# Patient Record
Sex: Male | Born: 1985 | Hispanic: Yes | Marital: Single | Smoking: Former smoker
Health system: Southern US, Community
[De-identification: ages and names within clinical notes are randomized; demographics above are authoritative.]

## PROBLEM LIST (undated history)

## (undated) DIAGNOSIS — J45909 Unspecified asthma, uncomplicated: Secondary | ICD-10-CM

## (undated) DIAGNOSIS — Z8709 Personal history of other diseases of the respiratory system: Secondary | ICD-10-CM

## (undated) DIAGNOSIS — R011 Cardiac murmur, unspecified: Secondary | ICD-10-CM

## (undated) DIAGNOSIS — K219 Gastro-esophageal reflux disease without esophagitis: Secondary | ICD-10-CM

## (undated) HISTORY — DX: Gastro-esophageal reflux disease without esophagitis: K21.9

## (undated) HISTORY — DX: Personal history of other diseases of the respiratory system: Z87.09

## (undated) HISTORY — PX: ABDOMINAL SURGERY: SHX537

## (undated) HISTORY — DX: Unspecified asthma, uncomplicated: J45.909

## (undated) HISTORY — PX: APPENDECTOMY: SHX54

---

## 2014-11-19 ENCOUNTER — Emergency Department (HOSPITAL_COMMUNITY): Payer: Self-pay

## 2014-11-19 ENCOUNTER — Emergency Department (HOSPITAL_COMMUNITY)
Admission: EM | Admit: 2014-11-19 | Discharge: 2014-11-19 | Disposition: A | Discharge status: Home / Self Care | Payer: Self-pay | Attending: Emergency Medicine | Admitting: Emergency Medicine

## 2014-11-19 ENCOUNTER — Encounter (HOSPITAL_COMMUNITY): Payer: Self-pay | Admitting: Emergency Medicine

## 2014-11-19 DIAGNOSIS — K219 Gastro-esophageal reflux disease without esophagitis: Secondary | ICD-10-CM | POA: Insufficient documentation

## 2014-11-19 DIAGNOSIS — R011 Cardiac murmur, unspecified: Secondary | ICD-10-CM | POA: Insufficient documentation

## 2014-11-19 DIAGNOSIS — R079 Chest pain, unspecified: Secondary | ICD-10-CM

## 2014-11-19 DIAGNOSIS — R0789 Other chest pain: Secondary | ICD-10-CM | POA: Insufficient documentation

## 2014-11-19 HISTORY — DX: Cardiac murmur, unspecified: R01.1

## 2014-11-19 LAB — CBC WITH DIFFERENTIAL/PLATELET
Basophils Absolute: 0 10*3/uL (ref 0.0–0.1)
Basophils Relative: 0 % (ref 0–1)
Eosinophils Absolute: 0.3 10*3/uL (ref 0.0–0.7)
Eosinophils Relative: 4 % (ref 0–5)
HEMATOCRIT: 45.1 % (ref 39.0–52.0)
HEMOGLOBIN: 15.3 g/dL (ref 13.0–17.0)
LYMPHS PCT: 29 % (ref 12–46)
Lymphs Abs: 2.3 10*3/uL (ref 0.7–4.0)
MCH: 28.5 pg (ref 26.0–34.0)
MCHC: 33.9 g/dL (ref 30.0–36.0)
MCV: 84 fL (ref 78.0–100.0)
Monocytes Absolute: 0.4 10*3/uL (ref 0.1–1.0)
Monocytes Relative: 5 % (ref 3–12)
NEUTROS ABS: 4.9 10*3/uL (ref 1.7–7.7)
Neutrophils Relative %: 62 % (ref 43–77)
PLATELETS: 210 10*3/uL (ref 150–400)
RBC: 5.37 MIL/uL (ref 4.22–5.81)
RDW: 13.5 % (ref 11.5–15.5)
WBC: 8 10*3/uL (ref 4.0–10.5)

## 2014-11-19 LAB — COMPREHENSIVE METABOLIC PANEL
ALK PHOS: 46 U/L (ref 38–126)
ALT: 29 U/L (ref 17–63)
ANION GAP: 10 (ref 5–15)
AST: 24 U/L (ref 15–41)
Albumin: 4.4 g/dL (ref 3.5–5.0)
BILIRUBIN TOTAL: 0.7 mg/dL (ref 0.3–1.2)
BUN: 16 mg/dL (ref 6–20)
CHLORIDE: 101 mmol/L (ref 101–111)
CO2: 28 mmol/L (ref 22–32)
Calcium: 9.2 mg/dL (ref 8.9–10.3)
Creatinine, Ser: 1.04 mg/dL (ref 0.61–1.24)
GFR calc Af Amer: >60 mL/min (ref >60)
GLUCOSE: 92 mg/dL (ref 65–99)
Potassium: 4.2 mmol/L (ref 3.5–5.1)
SODIUM: 139 mmol/L (ref 135–145)
TOTAL PROTEIN: 7.7 g/dL (ref 6.5–8.1)

## 2014-11-19 LAB — TROPONIN I

## 2014-11-19 MED ORDER — GI COCKTAIL ~~LOC~~
30.0000 mL | Freq: Once | ORAL | Status: AC
  Administered 2014-11-19: 30 mL via ORAL
  Filled 2014-11-19: qty 30

## 2014-11-19 MED ORDER — NAPROXEN 500 MG PO TABS: 500.0000 mg | ORAL_TABLET | Freq: Two times a day (BID) | ORAL | Status: DC

## 2014-11-19 MED ORDER — FAMOTIDINE 20 MG PO TABS: 20.0000 mg | ORAL_TABLET | Freq: Two times a day (BID) | ORAL | Status: DC

## 2014-11-19 NOTE — ED Notes (Signed)
PT c/o intermittent central chest pain (tightness) since Monday with headache in the am with generalized weakness. PT denies any cough, fever or SOB. PT states he has a heart murmur but no other heart history.

## 2014-11-19 NOTE — Discharge Instructions (Signed)

## 2014-11-19 NOTE — ED Provider Notes (Signed)
CSN: [id number]     Arrival date & time 11/19/14  1346 History   First MD Initiated Contact with Patient 11/19/14 1556     Chief Complaint  Patient presents with  . Chest Pain    Patient is a 29 y.o. male presenting with chest pain. The history is provided by the patient.  Chest Pain Pain location:  Substernal area Pain quality: pressure and sharp   Radiates to: when he moves certain ways it will go the neck and back. Pain severity:  Moderate Duration:  2 days Timing:  Constant (today more intermittent but constant from tues to thursday) Chronicity:  New Relieved by:  Certain positions Exacerbated by: lying flat. Ineffective treatments: nsaid. Associated symptoms: no abdominal pain, no fever, no lower extremity edema and no shortness of breath   Risk factors: no coronary artery disease, no prior DVT/PE and no smoking   NO family history of heart disease or lung problems   Past Medical History  Diagnosis Date  . Heart murmur    Past Surgical History  Procedure Laterality Date  . Appendectomy     History reviewed. No pertinent family history. History  Substance Use Topics  . Smoking status: Never Smoker   . Smokeless tobacco: Not on file  . Alcohol Use: Yes     Comment: occassionally     Review of Systems  Constitutional: Negative for fever.  Respiratory: Negative for shortness of breath.   Cardiovascular: Positive for chest pain.  Gastrointestinal: Negative for abdominal pain.  All other systems reviewed and are negative.     Allergies  Review of patient's allergies indicates no known allergies.  Home Medications   Prior to Admission medications   Medication Sig Start Date End Date Taking? Authorizing Provider  famotidine (PEPCID) 20 MG tablet Take 1 tablet (20 mg total) by mouth 2 (two) times daily. 11/19/14   Linwood Dibbles, MD  naproxen (NAPROSYN) 500 MG tablet Take 1 tablet (500 mg total) by mouth 2 (two) times daily with a meal. As needed for pain 11/19/14   Linwood Dibbles, MD   BP 119/69 mmHg  Pulse 48  Temp(Src) 97.9 F (36.6 C) (Oral)  Resp 11  Ht 5\' 4"  (1.626 m)  Wt 180 lb (81.647 kg)  BMI 30.88 kg/m2  SpO2 100% Physical Exam  Constitutional: He appears well-developed and well-nourished. No distress.  HENT:  Head: Normocephalic and atraumatic.  Right Ear: External ear normal.  Left Ear: External ear normal.  Eyes: Conjunctivae are normal. Right eye exhibits no discharge. Left eye exhibits no discharge. No scleral icterus.  Neck: Neck supple. No tracheal deviation present.  Cardiovascular: Normal rate, regular rhythm and intact distal pulses.   Pulmonary/Chest: Effort normal and breath sounds normal. No stridor. No respiratory distress. He has no wheezes. He has no rales.  Abdominal: Soft. Bowel sounds are normal. He exhibits no distension. There is no tenderness. There is no rebound and no guarding.  Musculoskeletal: He exhibits no edema or tenderness.  Neurological: He is alert. He has normal strength. No cranial nerve deficit (no facial droop, extraocular movements intact, no slurred speech) or sensory deficit. He exhibits normal muscle tone. He displays no seizure activity. Coordination normal.  Skin: Skin is warm and dry. No rash noted.  Psychiatric: He has a normal mood and affect.  Nursing note and vitals reviewed.   ED Course  Procedures (including critical care time) Labs Review Labs Reviewed  CBC WITH DIFFERENTIAL/PLATELET  COMPREHENSIVE METABOLIC PANEL  TROPONIN I  Imaging Review Dg Chest 2 View  11/19/2014   CLINICAL DATA:  Shortness of breath. Chest tightness. Symptoms over the past 3 days.  EXAM: CHEST  2 VIEW  COMPARISON:  None.  FINDINGS: The heart size and mediastinal contours are within normal limits. Both lungs are clear. The visualized skeletal structures are unremarkable.  IMPRESSION: No active cardiopulmonary disease.   Electronically Signed   By: Gaylyn Rong M.D.   On: 11/19/2014 14:30     EKG  Interpretation   Date/Time:  Thursday November 19 2014 15:52:49 EDT Ventricular Rate:  59 PR Interval:  125 QRS Duration: 94 QT Interval:  385 QTC Calculation: 381 R Axis:   73 Text Interpretation:  Sinus arrhythmia Baseline wander in lead(s) V3 No  significant change since last tracing Confirmed by Adylynn Hertenstein  MD-J, Laurina Fischl  ([id number]) on 11/19/2014 3:57:03 PM      MDM   Final diagnoses:  Chest pain, unspecified chest pain type  Gastroesophageal reflux disease without esophagitis    His symptoms sound somewhat suggestive of gastroesophageal reflux disease however Patient didn't know this much improvement after GI cocktail. His possible symptoms may be related to pericarditis as well considering the positional component.  I doubt that his symptoms are related to acute coronary syndrome. Cardiac enzymes are normal after over 24 hours of symptoms. No evidence of pneumonia. Low risk for PE, Perc negative.  Plan on discharge, outpatient follow up    Linwood Dibbles, MD 11/19/14 1706

## 2016-02-26 ENCOUNTER — Emergency Department (HOSPITAL_COMMUNITY)
Admission: EM | Admit: 2016-02-26 | Discharge: 2016-02-26 | Disposition: A | Discharge status: Home / Self Care | Payer: Self-pay | Attending: Emergency Medicine | Admitting: Emergency Medicine

## 2016-02-26 ENCOUNTER — Encounter (HOSPITAL_COMMUNITY): Payer: Self-pay

## 2016-02-26 DIAGNOSIS — R51 Headache: Secondary | ICD-10-CM | POA: Insufficient documentation

## 2016-02-26 DIAGNOSIS — R519 Headache, unspecified: Secondary | ICD-10-CM

## 2016-02-26 MED ORDER — DIPHENHYDRAMINE HCL 50 MG/ML IJ SOLN: 25.0000 mg | Freq: Once | INTRAMUSCULAR | Status: DC

## 2016-02-26 MED ORDER — METOCLOPRAMIDE HCL 5 MG/ML IJ SOLN
10.0000 mg | Freq: Once | INTRAMUSCULAR | Status: AC
  Administered 2016-02-26: 10 mg via INTRAVENOUS
  Filled 2016-02-26: qty 2

## 2016-02-26 MED ORDER — DIPHENHYDRAMINE HCL 50 MG/ML IJ SOLN
25.0000 mg | Freq: Once | INTRAMUSCULAR | Status: AC
  Administered 2016-02-26: 25 mg via INTRAVENOUS
  Filled 2016-02-26: qty 1

## 2016-02-26 MED ORDER — DEXAMETHASONE SODIUM PHOSPHATE 10 MG/ML IJ SOLN: 10.0000 mg | Freq: Once | INTRAMUSCULAR | Status: DC

## 2016-02-26 MED ORDER — DEXAMETHASONE SODIUM PHOSPHATE 10 MG/ML IJ SOLN
10.0000 mg | Freq: Once | INTRAMUSCULAR | Status: AC
  Administered 2016-02-26: 10 mg via INTRAVENOUS
  Filled 2016-02-26: qty 1

## 2016-02-26 NOTE — ED Triage Notes (Signed)
Pt reports headache and earache X4 days. Pt reports he does not have headaches frequently. Pt a&o X4 and reports he has been having blurred vision as well.

## 2016-02-26 NOTE — ED Provider Notes (Signed)
[facility]-EMERGENCY DEPT Provider Note   CSN: [id number] Arrival date & time: 02/26/16  1301     History   Chief Complaint Chief Complaint  Patient presents with  . Headache  . Otalgia    HPI Mark Kane is a 30 y.o. male.  Patient is a 30 year old male who presents with a headache. He states over the last 4 days he's had fairly constant headache that started in his left side of his head and radiates across his head. He has some pain in the left neck which is worse with movement. He denies any posterior neck pain. He denies any numbness or weakness to his extremities. He had one episode of some blurry vision while he was driving but no other vision changes. No nausea or vomiting. No fevers. No balance problems. No gait abnormalities. No prior history of similar headaches. No recent head injuries. No URI symptoms. He states the pain started gradually and was persistent for about 2 days then it went away completely for a day and then came back again yesterday. He does have some pain in his left ear.    Headache   Pertinent negatives include no fever, no shortness of breath, no nausea and no vomiting.  Otalgia  Associated symptoms include headaches and neck pain. Pertinent negatives include no rhinorrhea, no abdominal pain, no diarrhea, no vomiting, no cough and no rash.    Past Medical History:  Diagnosis Date  . Heart murmur     There are no active problems to display for this patient.   Past Surgical History:  Procedure Laterality Date  . APPENDECTOMY         Home Medications    Prior to Admission medications   Medication Sig Start Date End Date Taking? Authorizing Provider  famotidine (PEPCID) 20 MG tablet Take 1 tablet (20 mg total) by mouth 2 (two) times daily. 11/19/14   Linwood Dibbles, MD  naproxen (NAPROSYN) 500 MG tablet Take 1 tablet (500 mg total) by mouth 2 (two) times daily with a meal. As needed for pain 11/19/14   Linwood Dibbles, MD    Family History History  reviewed. No pertinent family history.  Social History Social History  Substance Use Topics  . Smoking status: Never Smoker  . Smokeless tobacco: Never Used  . Alcohol use Yes     Comment: occassionally      Allergies   Review of patient's allergies indicates no known allergies.   Review of Systems Review of Systems  Constitutional: Negative for chills, diaphoresis, fatigue and fever.  HENT: Positive for ear pain. Negative for congestion, rhinorrhea and sneezing.   Eyes: Negative.   Respiratory: Negative for cough, chest tightness and shortness of breath.   Cardiovascular: Negative for chest pain and leg swelling.  Gastrointestinal: Negative for abdominal pain, blood in stool, diarrhea, nausea and vomiting.  Genitourinary: Negative for difficulty urinating, flank pain, frequency and hematuria.  Musculoskeletal: Positive for neck pain. Negative for arthralgias and back pain.  Skin: Negative for rash.  Neurological: Positive for headaches. Negative for dizziness, speech difficulty, weakness and numbness.     Physical Exam Updated Vital Signs BP 135/80 (BP Location: Left Arm)   Pulse 70   Temp 98 F (36.7 C) (Oral)   Resp 22   Ht 5\' 4"  (1.626 m)   Wt 200 lb (90.7 kg)   SpO2 98%   BMI 34.33 kg/m   Physical Exam  Constitutional: He is oriented to person, place, and time. He appears well-developed and  well-nourished.  HENT:  Head: Normocephalic and atraumatic.  Eyes: Pupils are equal, round, and reactive to light.  No photophobia, fundi not well visualized  Neck: Normal range of motion. Neck supple.  Some point tenderness to the left mid paraspinal area. No other neck tenderness. No meningismus.  Cardiovascular: Normal rate, regular rhythm and normal heart sounds.   Pulmonary/Chest: Effort normal and breath sounds normal. No respiratory distress. He has no wheezes. He has no rales. He exhibits no tenderness.  Abdominal: Soft. Bowel sounds are normal. There is no  tenderness. There is no rebound and no guarding.  Musculoskeletal: Normal range of motion. He exhibits no edema.  Lymphadenopathy:    He has no cervical adenopathy.  Neurological: He is alert and oriented to person, place, and time. He has normal strength. No cranial nerve deficit or sensory deficit.  Finger-nose intact, no pronator drift  Skin: Skin is warm and dry. No rash noted.  Psychiatric: He has a normal mood and affect.     ED Treatments / Results  Labs (all labs ordered are listed, but only abnormal results are displayed) Labs Reviewed - No data to display  EKG  EKG Interpretation None       Radiology No results found.  Procedures Procedures (including critical care time)  Medications Ordered in ED Medications  metoCLOPramide (REGLAN) injection 10 mg (10 mg Intravenous Given 02/26/16 1637)  dexamethasone (DECADRON) injection 10 mg (10 mg Intravenous Given 02/26/16 1637)  diphenhydrAMINE (BENADRYL) injection 25 mg (25 mg Intravenous Given 02/26/16 1636)     Initial Impression / Assessment and Plan / ED Course  I have reviewed the triage vital signs and the nursing notes.  Pertinent labs & imaging results that were available during my care of the patient were reviewed by me and considered in my medical decision making (see chart for details).  Clinical Course    Patient presents with a headache. He has no neurologic deficits. No findings that would be more suspicious for subarachnoid hemorrhage or meningitis. He has some point tenderness to his left paraspinal area it's may be the etiology for the headache. There is no evidence of an ear infection. No dental etiology is noted. He was given migraine cocktail and feels 100% better after this. He's ambulating normally and ready to go home. He was discharged home in good condition. At this point I didn't feel that imaging studies were indicated. He was encouraged to return if he has any worsening symptoms. He was given an  outpatient resource guide for possible outpatient follow-up.  Final Clinical Impressions(s) / ED Diagnoses   Final diagnoses:  None    New Prescriptions New Prescriptions   No medications on file     Rolan BuccoMelanie Adylee Leonardo, MD 02/26/16 1800

## 2016-03-01 ENCOUNTER — Emergency Department (HOSPITAL_COMMUNITY): Payer: Self-pay

## 2016-03-01 ENCOUNTER — Emergency Department (HOSPITAL_COMMUNITY)
Admission: EM | Admit: 2016-03-01 | Discharge: 2016-03-01 | Disposition: A | Discharge status: Home / Self Care | Payer: Self-pay | Attending: Emergency Medicine | Admitting: Emergency Medicine

## 2016-03-01 ENCOUNTER — Encounter (HOSPITAL_COMMUNITY): Payer: Self-pay | Admitting: Emergency Medicine

## 2016-03-01 DIAGNOSIS — Z791 Long term (current) use of non-steroidal anti-inflammatories (NSAID): Secondary | ICD-10-CM | POA: Insufficient documentation

## 2016-03-01 DIAGNOSIS — M542 Cervicalgia: Secondary | ICD-10-CM | POA: Insufficient documentation

## 2016-03-01 DIAGNOSIS — R519 Headache, unspecified: Secondary | ICD-10-CM

## 2016-03-01 DIAGNOSIS — H9202 Otalgia, left ear: Secondary | ICD-10-CM | POA: Insufficient documentation

## 2016-03-01 DIAGNOSIS — R51 Headache: Secondary | ICD-10-CM | POA: Insufficient documentation

## 2016-03-01 LAB — CBC WITH DIFFERENTIAL/PLATELET
Basophils Absolute: 0 10*3/uL (ref 0.0–0.1)
Basophils Relative: 0 %
EOS ABS: 0.3 10*3/uL (ref 0.0–0.7)
EOS PCT: 3 %
HCT: 43.6 % (ref 39.0–52.0)
Hemoglobin: 14.9 g/dL (ref 13.0–17.0)
LYMPHS ABS: 2.5 10*3/uL (ref 0.7–4.0)
Lymphocytes Relative: 26 %
MCH: 28.5 pg (ref 26.0–34.0)
MCHC: 34.2 g/dL (ref 30.0–36.0)
MCV: 83.5 fL (ref 78.0–100.0)
Monocytes Absolute: 0.7 10*3/uL (ref 0.1–1.0)
Monocytes Relative: 8 %
NEUTROS PCT: 63 %
Neutro Abs: 6.1 10*3/uL (ref 1.7–7.7)
PLATELETS: 217 10*3/uL (ref 150–400)
RBC: 5.22 MIL/uL (ref 4.22–5.81)
RDW: 13 % (ref 11.5–15.5)
WBC: 9.6 10*3/uL (ref 4.0–10.5)

## 2016-03-01 LAB — COMPREHENSIVE METABOLIC PANEL
ALBUMIN: 4.4 g/dL (ref 3.5–5.0)
ALT: 45 U/L (ref 17–63)
ANION GAP: 6 (ref 5–15)
AST: 23 U/L (ref 15–41)
Alkaline Phosphatase: 47 U/L (ref 38–126)
BUN: 20 mg/dL (ref 6–20)
CHLORIDE: 107 mmol/L (ref 101–111)
CO2: 25 mmol/L (ref 22–32)
Calcium: 9.2 mg/dL (ref 8.9–10.3)
Creatinine, Ser: 0.96 mg/dL (ref 0.61–1.24)
GFR calc non Af Amer: >60 mL/min (ref >60)
GLUCOSE: 89 mg/dL (ref 65–99)
Potassium: 4 mmol/L (ref 3.5–5.1)
SODIUM: 138 mmol/L (ref 135–145)
TOTAL PROTEIN: 8.2 g/dL — AB (ref 6.5–8.1)
Total Bilirubin: 0.6 mg/dL (ref 0.3–1.2)

## 2016-03-01 MED ORDER — HYDROMORPHONE HCL 1 MG/ML IJ SOLN
1.0000 mg | Freq: Once | INTRAMUSCULAR | Status: DC
  Filled 2016-03-01: qty 1

## 2016-03-01 MED ORDER — NAPROXEN 500 MG PO TABS: 500.0000 mg | ORAL_TABLET | Freq: Two times a day (BID) | ORAL | 0 refills | Status: DC

## 2016-03-01 MED ORDER — LORAZEPAM 2 MG/ML IJ SOLN
0.5000 mg | Freq: Once | INTRAMUSCULAR | Status: DC
  Filled 2016-03-01: qty 1

## 2016-03-01 MED ORDER — TRAMADOL HCL 50 MG PO TABS: ORAL_TABLET | ORAL | 0 refills | Status: DC

## 2016-03-01 NOTE — Discharge Instructions (Signed)
Follow-up with Dr.[place] if your headache did not improve

## 2016-03-01 NOTE — ED Triage Notes (Signed)
Pt continues to have headache, neck pain, tension, pressure pain.  [facility] was given migraine cocktail, relief while there, pain return when home. Pt states pain is worse when sitting. He has noticed tingling and weakness in right hand at time with pain.

## 2016-03-01 NOTE — ED Notes (Signed)
Patient refused medications at this time, patient states "i just want to know whats wrong"

## 2016-03-01 NOTE — ED Provider Notes (Signed)
[facility]-EMERGENCY DEPT Provider Note   CSN: [id number] Arrival date & time: 03/01/16  1700  By signing my name below, I, Javier Docker, attest that this documentation has been prepared under the direction and in the presence of Farley Ly, MD. Electronically Signed: Javier Docker, ER Scribe. 01/22/2016. 9:12 PM.  History   Chief Complaint Chief Complaint  Patient presents with  . Headache   Patient complains of a headache and left upper neck pain. No fever chills   The history is provided by the patient. No language interpreter was used.  Headache   This is a new problem. The current episode started more than 1 week ago. The problem occurs constantly. The problem has not changed since onset.The headache is associated with nothing. The pain is located in the bilateral and parietal region. The quality of the pain is described as dull. The pain is at a severity of 5/10. The pain is severe. The pain radiates to the left neck. Pertinent negatives include no anorexia and no fever. He has tried NSAIDs for the symptoms.   HPI Comments: Mark Kane is a 30 y.o. male who presents to the Emergency department complaining of eight days of constant severe HA that radiates from his left lateral neck through his left ear and head. He was seen at [facility] ED 90/16/17 with the same sx and was diagnosed with a migraine HA and prescribed a migraine cocktail after which he was discharged with return precautions. His sx have not improved. The pain in his neck is improved with standing, worsened with sitting or twisting his head.    Past Medical History:  Diagnosis Date  . Heart murmur     There are no active problems to display for this patient.   Past Surgical History:  Procedure Laterality Date  . APPENDECTOMY       Home Medications    Prior to Admission medications   Medication Sig Start Date End Date Taking? Authorizing Provider  ibuprofen (ADVIL,MOTRIN) 200 MG tablet Take 400 mg  by mouth every 6 (six) hours as needed for moderate pain.   Yes Historical Provider, MD    Family History No family history on file.  Social History Social History  Substance Use Topics  . Smoking status: Never Smoker  . Smokeless tobacco: Never Used  . Alcohol use Yes     Comment: occassionally      Allergies   Review of patient's allergies indicates no known allergies.   Review of Systems Review of Systems  Constitutional: Negative for appetite change, chills, fatigue and fever.  HENT: Positive for ear pain. Negative for congestion, ear discharge and sinus pressure.   Eyes: Negative for discharge.  Respiratory: Negative for cough.   Cardiovascular: Negative for chest pain.  Gastrointestinal: Negative for abdominal pain, anorexia and diarrhea.  Genitourinary: Negative for frequency and hematuria.  Musculoskeletal: Positive for neck pain. Negative for back pain and neck stiffness.  Skin: Negative for rash.  Neurological: Positive for headaches. Negative for seizures, weakness and numbness.  Psychiatric/Behavioral: Negative for hallucinations.     Physical Exam Updated Vital Signs BP 150/81 (BP Location: Left Arm)   Pulse 83   Temp 98.4 F (36.9 C) (Oral)   Resp 20   Ht 5\' 4"  (1.626 m)   Wt 196 lb (88.9 kg)   SpO2 100%   BMI 33.64 kg/m   Physical Exam  Constitutional: He is oriented to person, place, and time. He appears well-developed.  HENT:  Head:  Normocephalic.  Supple neck but tenderness to the superior left neck muscle  Eyes: Conjunctivae and EOM are normal. No scleral icterus.  Neck: Neck supple. No thyromegaly present.  Cardiovascular: Normal rate and regular rhythm.  Exam reveals no gallop and no friction rub.   No murmur heard. Pulmonary/Chest: No stridor. He has no wheezes. He has no rales. He exhibits no tenderness.  Abdominal: He exhibits no distension. There is no tenderness. There is no rebound.  Musculoskeletal: Normal range of motion. He  exhibits no edema.  TTP to left lateral neck.  Lymphadenopathy:    He has no cervical adenopathy.  Neurological: He is oriented to person, place, and time. He exhibits normal muscle tone. Coordination normal.  Skin: No rash noted. No erythema.  Psychiatric: He has a normal mood and affect. His behavior is normal.     ED Treatments / Results  Labs (all labs ordered are listed, but only abnormal results are displayed) Labs Reviewed  CBC WITH DIFFERENTIAL/PLATELET  COMPREHENSIVE METABOLIC PANEL    EKG  EKG Interpretation None       Radiology No results found.  Procedures Procedures (including critical care time)  Medications Ordered in ED Medications  HYDROmorphone (DILAUDID) injection 1 mg (not administered)  LORazepam (ATIVAN) injection 0.5 mg (not administered)     Initial Impression / Assessment and Plan / ED Course  I have reviewed the triage vital signs and the nursing notes.  Pertinent labs & imaging results that were available during my care of the patient were reviewed by me and considered in my medical decision making (see chart for details).  Clinical Course    Labs and CT scan negative. Patient will follow-up with neurology as needed.  Patient given Naprosyn and Ultram  Final Clinical Impressions(s) / ED Diagnoses   Final diagnoses:  None    New Prescriptions New Prescriptions   No medications on file    The chart was scribed for me under my direct supervision.  I personally performed the history, physical, and medical decision making and all procedures in the evaluation of this patient.Bethann Berkshire.           Carilyn Woolston, MD 03/01/16 [phone]

## 2016-03-06 ENCOUNTER — Encounter (HOSPITAL_COMMUNITY): Payer: Self-pay | Admitting: *Deleted

## 2016-03-06 ENCOUNTER — Emergency Department (HOSPITAL_COMMUNITY)
Admission: EM | Admit: 2016-03-06 | Discharge: 2016-03-06 | Disposition: A | Discharge status: Home / Self Care | Payer: Self-pay | Attending: Emergency Medicine | Admitting: Emergency Medicine

## 2016-03-06 DIAGNOSIS — M436 Torticollis: Secondary | ICD-10-CM | POA: Insufficient documentation

## 2016-03-06 DIAGNOSIS — R040 Epistaxis: Secondary | ICD-10-CM | POA: Insufficient documentation

## 2016-03-06 MED ORDER — METHOCARBAMOL 500 MG PO TABS: 500.0000 mg | ORAL_TABLET | Freq: Two times a day (BID) | ORAL | 0 refills | Status: DC

## 2016-03-06 MED ORDER — METHOCARBAMOL 500 MG PO TABS
1000.0000 mg | ORAL_TABLET | Freq: Once | ORAL | Status: AC
  Administered 2016-03-06: 1000 mg via ORAL
  Filled 2016-03-06: qty 2
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
## 2016-03-06 NOTE — ED Triage Notes (Signed)
PT at 2 dif [facility] facilities since last Sat for L occipital headache/pressure.  Tx with naproxen and tramadol which has reduced symptoms somewhat, although he states he can't lay down b/c it increases his pain and the pain is spreading to ear.  HE came today b/c he had a 15 min nose bleed that did not produce clots. Tenderness on palpation on palpation of L lat neck.

## 2016-03-07 DIAGNOSIS — Z139 Encounter for screening, unspecified: Secondary | ICD-10-CM

## 2016-03-07 NOTE — Congregational Nurse Program (Signed)
Congregational Nurse Program Note  Date of Encounter: 03/07/2016  Past Medical History: Past Medical History:  Diagnosis Date  . Heart murmur     Encounter Details:     CNP Questionnaire - 03/07/16 1320      Patient Demographics   Is this a new or existing patient? New   Patient is considered a/an Immigrant   Race Latino/Hispanic     Patient Assistance   Location of Patient Assistance [facility]   Patient's financial/insurance status Low Income   Uninsured Patient Yes   Interventions Counseled to make appt. with provider;Assisted patient in making appt.   Patient referred to apply for the following financial assistance [facility]   Food insecurities addressed Not Applicable   Transportation assistance No   Assistance securing medications No   Educational health offerings Acute disease;Navigating the healthcare system;Hypertension     Encounter Details   Primary purpose of visit Acute Illness/Condition Visit;Education/Health Concerns;Navigating the Healthcare System   Was an Emergency Department visit averted? No   Does patient have a medical provider? No   Patient referred to Clinic;Establish PCP   Was a mental health screening completed? (GAINS tool) No   Does patient have dental issues? No   Does patient have vision issues? No   Does your patient have an abnormal blood pressure today? Yes   Since previous encounter, have you referred patient for abnormal blood pressure that resulted in a new diagnosis or medication change? No   Does your patient have an abnormal blood glucose today? No   Since previous encounter, have you referred patient for abnormal blood glucose that resulted in a new diagnosis or medication change? No   Was there a life-saving intervention made? No     [facility] to [facility]. Client is complaining of posterior neck and head pain on the left side that radiates to his left ear and causes headaches. [facility] twice this month the last being 03/06/16. Client states he had a nosebleed then and the head and neck pain and he was concerned that he could be bleeding in his head. Client alert and oriented to person and place and answers questions appropriately. He works part-time for [employer] two days a week. He does not have insurance. He states that he was told he needed a primary medical provider. Client appears calm but states he has many stressors such as a new apartment and car and now hospital bills. Referred client to go today and speak with Jeani HawkingAnnie Penn financial counselor Fransisca Connorsshley Hilton to apply for possible assistance with his medical bills [facility]. Client states he will do that today. Client wishes to be referred [facility] [place] for his Primary medical care. Discussed with client the items he will need to take for his appointment scheduled on 03/14/16 at 11:00am. Client states understanding.  Current medications: Robaxin(Emergency room) , Ibuprofen and naprosyn. Client states that since taking the muscle relaxer that his pain has decreased. Counseled client not to take ibuprofen and naprosyn to take one and to take with food. Blood pressure 144/82 today so unsure if this is elevated due to pain and stress or mild hypertension. Referral to medical provider to determine.  Client has normal gait, no weakness in extremities, no neurological deficits noted. Information written down for client along with his appointment information and what he needs to bring to his first appointment. Will follow as needed.  NO MD LIVE visit since client seen yesterday at Emergency Room.  Instructed client that if he experiences any weakness of his arms or legs, any difficulty with speech or facial drooping to call 911 immediately.

## 2016-03-14 ENCOUNTER — Encounter: Payer: Self-pay | Admitting: Physician Assistant

## 2016-03-14 ENCOUNTER — Ambulatory Visit: Payer: Self-pay | Admitting: Physician Assistant

## 2016-03-14 VITALS — BP 118/80 | HR 67 | Temp 97.7°F | Ht 64.0 in | Wt 200.5 lb

## 2016-03-14 DIAGNOSIS — M542 Cervicalgia: Secondary | ICD-10-CM

## 2016-03-14 DIAGNOSIS — E66811 Obesity, class 1: Secondary | ICD-10-CM | POA: Insufficient documentation

## 2016-03-14 DIAGNOSIS — Z711 Person with feared health complaint in whom no diagnosis is made: Secondary | ICD-10-CM

## 2016-03-14 DIAGNOSIS — Z131 Encounter for screening for diabetes mellitus: Secondary | ICD-10-CM

## 2016-03-14 DIAGNOSIS — Z6834 Body mass index (BMI) 34.0-34.9, adult: Secondary | ICD-10-CM

## 2016-03-14 DIAGNOSIS — Z13220 Encounter for screening for lipoid disorders: Secondary | ICD-10-CM

## 2016-03-14 DIAGNOSIS — E669 Obesity, unspecified: Secondary | ICD-10-CM

## 2016-03-14 LAB — GLUCOSE, POCT (MANUAL RESULT ENTRY): POC GLUCOSE: 92 mg/dL (ref 70–99)

## 2016-03-14 NOTE — Patient Instructions (Signed)

## 2016-03-14 NOTE — Progress Notes (Signed)
BP 118/80 (BP Location: Left Arm, Patient Position: Sitting, Cuff Size: Large)   Pulse 67   Temp 97.7 F (36.5 C)   Ht 5\' 4"  (1.626 m)   Wt 200 lb 8 oz (90.9 kg)   SpO2 99%   BMI 34.42 kg/m    Subjective:    Patient ID: Mark Kane, male    DOB: [birth year], 30 y.o.   MRN: [id number]  HPI: Mark Kane is a 30 y.o. male presenting on 03/14/2016 for New Patient (Initial Visit) (pt has not had PCP since age 66); Rash (on head of penis. since age 84); Headache (pt went to [facility] for HA. pt states he still gets HA but is not as bad); and Otalgia (pt thinks it may be from teeth)   HPI   Chief Complaint  Patient presents with  . New Patient (Initial Visit)    pt has not had PCP since age 20  . Rash    on head of penis. since age 26  . Headache    pt went to [facility] for [facility]. pt states he still gets HA but is not as bad  . Otalgia    pt thinks it may be from teeth     Pt states he had a circumcision at age 19 to help a yeast infection go away but he says the yeast never went away.   Pt still with little bit of L posterior headache- thinks it's muscle strain.  Pt states L earache started with headaches and thinks pain may be from his teeth.  Pt works at [employer] on [employer] part-time  Relevant past medical, surgical, family and social history reviewed and updated as indicated. Interim medical history since our last visit reviewed. Allergies and medications reviewed and updated.   Current Outpatient Prescriptions:  .  Cholecalciferol (VITAMIN D3 PO), Take 1 tablet by mouth daily., Disp: , Rfl:  .  Cyanocobalamin (VITAMIN B 12 PO), Take 1 tablet by mouth daily., Disp: , Rfl:  .  ibuprofen (ADVIL,MOTRIN) 200 MG tablet, Take 400 mg by mouth every 6 (six) hours as needed for moderate pain., Disp: , Rfl:  .  methocarbamol (ROBAXIN) 500 MG tablet, Take 1 tablet (500 mg total) by mouth 2 (two) times daily., Disp: 20 tablet, Rfl: 0 .  Omega-3 Fatty Acids (FISH OIL PO), Take 2 capsules by  mouth daily., Disp: , Rfl:   Review of Systems  Constitutional: Positive for fatigue. Negative for appetite change, chills, diaphoresis, fever and unexpected weight change.  HENT: Positive for dental problem and ear pain. Negative for congestion, drooling, facial swelling, hearing loss, mouth sores, sneezing, sore throat, trouble swallowing and voice change.   Eyes: Negative for pain, discharge, redness, itching and visual disturbance.  Respiratory: Negative for cough, choking, shortness of breath and wheezing.   Cardiovascular: Negative for chest pain, palpitations and leg swelling.  Gastrointestinal: Negative for abdominal pain, blood in stool, constipation, diarrhea and vomiting.  Endocrine: Negative for cold intolerance, heat intolerance and polydipsia.  Genitourinary: Negative for decreased urine volume, dysuria and hematuria.  Musculoskeletal: Negative for arthralgias, back pain and gait problem.  Skin: Negative for rash.  Allergic/Immunologic: Negative for environmental allergies.  Neurological: Negative for seizures, syncope, light-headedness and headaches.  Hematological: Negative for adenopathy.  Psychiatric/Behavioral: Negative for agitation, dysphoric mood and suicidal ideas. The patient is not nervous/anxious.     Per HPI unless specifically indicated above     Objective:    BP 118/80 (BP Location: Left Arm, Patient  Position: Sitting, Cuff Size: Large)   Pulse 67   Temp 97.7 F (36.5 C)   Ht 5\' 4"  (1.626 m)   Wt 200 lb 8 oz (90.9 kg)   SpO2 99%   BMI 34.42 kg/m   Wt Readings from Last 3 Encounters:  03/14/16 200 lb 8 oz (90.9 kg)  03/07/16 201 lb 9.6 oz (91.4 kg)  03/06/16 196 lb (88.9 kg)    Physical Exam  Constitutional: He is oriented to person, place, and time. He appears well-developed and well-nourished.  HENT:  Head: Normocephalic and atraumatic.  Mouth/Throat: Oropharynx is clear and moist. No oropharyngeal exudate.  Eyes: Conjunctivae and EOM are  normal. Pupils are equal, round, and reactive to light.  Neck: Neck supple. No thyromegaly present.  Cardiovascular: Normal rate and regular rhythm.   Pulmonary/Chest: Effort normal and breath sounds normal. He has no wheezes. He has no rales.  Abdominal: Soft. Bowel sounds are normal. He exhibits no mass. There is no hepatosplenomegaly. There is no tenderness.  Genitourinary: Penis normal. Circumcised. No penile erythema. No discharge found.  Genitourinary Comments: No rash or lesions seen. (nurse Berenice chaperone)  Musculoskeletal: He exhibits no edema.       Cervical back: He exhibits tenderness. He exhibits normal range of motion, no bony tenderness, no swelling, no edema and no deformity.       Back:  Tenderness Left trapezius muscle  Lymphadenopathy:    He has no cervical adenopathy.  Neurological: He is alert and oriented to person, place, and time.  Skin: Skin is warm and dry. No rash noted.  Psychiatric: He has a normal mood and affect. His behavior is normal. Thought content normal.  Vitals reviewed.   Results for orders placed or performed in visit on 03/14/16  POCT Glucose (CBG)  Result Value Ref Range   POC Glucose 92 70 - 99 mg/dl      Assessment & Plan:   Encounter Diagnoses  Name Primary?  . Neck pain Yes  . Class 1 obesity without serious comorbidity with body mass index (BMI) of 34.0 to 34.9 in adult, unspecified obesity type   . Feared complaint without diagnosis   . Screening for diabetes mellitus (DM)   . Screening cholesterol level     -Check fasting lipids- will call with results -Reassured pt no rash/yeast -Counseled on care for neck and gave handout -Counseled on weight management -follow up one year.  RTO sooner prn

## 2016-06-15 ENCOUNTER — Encounter: Payer: Self-pay | Admitting: Urgent Care

## 2016-06-15 ENCOUNTER — Ambulatory Visit (INDEPENDENT_AMBULATORY_CARE_PROVIDER_SITE_OTHER): Payer: BLUE CROSS/BLUE SHIELD | Admitting: Urgent Care

## 2016-06-15 VITALS — BP 123/81 | HR 75 | Temp 98.9°F | Resp 16 | Ht 64.0 in | Wt 190.4 lb

## 2016-06-15 DIAGNOSIS — R002 Palpitations: Secondary | ICD-10-CM | POA: Diagnosis not present

## 2016-06-15 DIAGNOSIS — E669 Obesity, unspecified: Secondary | ICD-10-CM

## 2016-06-15 DIAGNOSIS — F1010 Alcohol abuse, uncomplicated: Secondary | ICD-10-CM | POA: Diagnosis not present

## 2016-06-15 DIAGNOSIS — K5900 Constipation, unspecified: Secondary | ICD-10-CM

## 2016-06-15 DIAGNOSIS — R0789 Other chest pain: Secondary | ICD-10-CM | POA: Diagnosis not present

## 2016-06-15 DIAGNOSIS — Z9049 Acquired absence of other specified parts of digestive tract: Secondary | ICD-10-CM

## 2016-06-15 DIAGNOSIS — R1013 Epigastric pain: Secondary | ICD-10-CM | POA: Diagnosis not present

## 2016-06-15 MED ORDER — POLYETHYLENE GLYCOL 3350 17 GM/SCOOP PO POWD: 17.0000 g | Freq: Every day | ORAL | 5 refills | Status: AC | PRN

## 2016-06-15 MED ORDER — DOCUSATE SODIUM 50 MG PO CAPS: 50.0000 mg | ORAL_CAPSULE | Freq: Two times a day (BID) | ORAL | 5 refills | Status: AC

## 2016-06-15 NOTE — Progress Notes (Deleted)
    MRN: [id number]  Subjective:   [name] is a 31 y.o. male presenting for annual physical exam and ***.  Medical care team includes: PCP: Jacquelin HawkingShannon McElroy, PA-C Vision: *** Dental: *** Specialists:      Health Maintenance:  Mark Kane has Class 1 obesity without serious comorbidity with body mass index (BMI) of 34.0 to 34.9 in adult on his problem list.  Mark Kane has a current medication list which includes the following prescription(s): cholecalciferol, multivitamin, omega-3 fatty acids, and methocarbamol. He has No Known Allergies.  Mark Kane  has a past medical history of Asthma; GERD (gastroesophageal reflux disease); Heart murmur; and History of asthma. 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  family history includes Anxiety disorder in his mother; Cancer in his mother; Crohn's disease in his brother; Fibromyalgia in his mother.  Immunizations:   ROS  Objective:   Vitals: BP 123/81   Pulse 75   Temp 98.9 F (37.2 C) (Oral)   Resp 16   Ht 5\' 4"  (1.626 m)   Wt 190 lb 6.4 oz (86.4 kg)   SpO2 99%   BMI 32.68 kg/m   Physical Exam  No results found for this or any previous visit (from the past 24 hour(s)).  Assessment and Plan :     Mark BambergMario Liani Caris, PA-C [facility] [phone] 06/15/2016  10:09 AM

## 2016-06-15 NOTE — Patient Instructions (Addendum)
Abdominal Pain, Adult Abdominal pain can be caused by many things. Often, abdominal pain is not serious and it gets better with no treatment or by being treated at home. However, sometimes abdominal pain is serious. Your health care provider will do a medical history and a physical exam to try to determine the cause of your abdominal pain. Follow these instructions at home:  Take over-the-counter and prescription medicines only as told by your health care provider. Do not take a laxative unless told by your health care provider.  Drink enough fluid to keep your urine clear or pale yellow.  Watch your condition for any changes.  Keep all follow-up visits as told by your health care provider. This is important. Contact a health care provider if:  Your abdominal pain changes or gets worse.  You are not hungry or you lose weight without trying.  You are constipated or have diarrhea for more than 2-3 days.  You have pain when you urinate or have a bowel movement.  Your abdominal pain wakes you up at night.  Your pain gets worse with meals, after eating, or with certain foods.  You are throwing up and cannot keep anything down.  You have a fever. Get help right away if:  Your pain does not go away as soon as your health care provider told you to expect.  You cannot stop throwing up.  Your pain is only in areas of the abdomen, such as the right side or the left lower portion of the abdomen.  You have bloody or black stools, or stools that look like tar.  You have severe pain, cramping, or bloating in your abdomen.  You have signs of dehydration, such as:  Dark urine, very little urine, or no urine.  Cracked lips.  Dry mouth.  Sunken eyes.  Sleepiness.  Weakness. This information is not intended to replace advice given to you by your health care provider. Make sure you discuss any questions you have with your health care provider. Document Released: 03/08/2005 Document  Revised: 12/17/2015 Document Reviewed: 11/10/2015 Elsevier Interactive Patient Education  2017 [employer]     Constipation, Adult Constipation is when a person has fewer bowel movements in a week than normal, has difficulty having a bowel movement, or has stools that are dry, hard, or larger than normal. Constipation may be caused by an underlying condition. It may become worse with age if a person takes certain medicines and does not take in enough fluids. Follow these instructions at home: Eating and drinking  Eat foods that have a lot of fiber, such as fresh fruits and vegetables, whole grains, and beans.  Limit foods that are high in fat, low in fiber, or overly processed, such as french fries, hamburgers, cookies, candies, and soda.  Drink enough fluid to keep your urine clear or pale yellow. General instructions  Exercise regularly or as told by your health care provider.  Go to the restroom when you have the urge to go. Do not hold it in.  Take over-the-counter and prescription medicines only as told by your health care provider. These include any fiber supplements.  Practice pelvic floor retraining exercises, such as deep breathing while relaxing the lower abdomen and pelvic floor relaxation during bowel movements.  Watch your condition for any changes.  Keep all follow-up visits as told by your health care provider. This is important. Contact a health care provider if:  You have pain that gets worse.  You have a fever.  You do not have a bowel movement after 4 days.  You vomit.  You are not hungry.  You lose weight.  You are bleeding from the anus.  You have thin, pencil-like stools. Get help right away if:  You have a fever and your symptoms suddenly get worse.  You leak stool or have blood in your stool.  Your abdomen is bloated.  You have severe pain in your abdomen.  You feel dizzy or you faint. This information is not intended to replace  advice given to you by your health care provider. Make sure you discuss any questions you have with your health care provider. Document Released: 02/25/2004 Document Revised: 12/17/2015 Document Reviewed: 11/17/2015 Elsevier Interactive Patient Education  2017 [employer].     IF you received an x-ray today, you will receive an invoice from [facility]. Please contact [facility] at [phone] with questions or concerns regarding your invoice.   IF you received labwork today, you will receive an invoice from [facility]. Please contact [facility] at [phone] with questions or concerns regarding your invoice.   Our billing staff will not be able to assist you with questions regarding bills from these companies.  You will be contacted with the lab results as soon as they are available. The fastest way to get your results is to activate your My Chart account. Instructions are located on the last page of this paperwork. If you have not heard from [place] regarding the results in 2 weeks, please contact this office.

## 2016-06-15 NOTE — Progress Notes (Signed)
MRN: [id number] DOB: 01-02-1986  Subjective:   Mark Kane is a 31 y.o. male presenting for chief complaint of Chest Pain  Chest pain - Reports several month history of mid-left sided chest pain. Also has epigastric pain associated with meals. Patient has a history of heart murmur, has had 3 episodes of heart palpitations in the past month following drinking coffee. He was having one cup of coffee per day but ending up stopping this. Has used Prilosec, Nexium, ranitidine without any relief despite using these for several weeks. Has tried to eat very healthily to help as well. Complains of longstanding constipation despite healthy diet and hydrating very well consistently. Admits that he would drink coffee to help promote bowel movements. Denies fever, nausea, vomiting, bloody stools. Patient binge drinks about once a month. Typically drinks 12 pack of beers at a party, holidays.  ROS - He does have multiple concerns. Reports weight loss, itching over his right hand, feeling fatigue, cold intolerance, frequent thirst, urinary frequency, joint pains, sore throat, nose bleeds. He also has a history of fungal infection of the penis. He has previously tried diflucan to address this and even underwent circumcision to help with this but his rash has persisted.  [name] has a current medication list which includes the following prescription(s): cholecalciferol, multivitamin, and omega-3 fatty acids. Also has No Known Allergies.  Eon  has a past medical history of Asthma; GERD (gastroesophageal reflux disease); Heart murmur; and History of asthma. Also  has a past surgical history that includes Abdominal surgery; Appendectomy (age 35); and Circumcision, non-newborn (age 57).  Mother has history of ovarian cancer, anxiety and panic attacks, fibromyalgia. Brother has a history of Crohn disease.   Objective:   Vitals: BP 123/81   Pulse 75   Temp 98.9 F (37.2 C) (Oral)   Resp 16   Ht 5\' 4"  (1.626  m)   Wt 190 lb 6.4 oz (86.4 kg)   SpO2 99%   BMI 32.68 kg/m   Wt Readings from Last 3 Encounters:  06/15/16 190 lb 6.4 oz (86.4 kg)  03/14/16 200 lb 8 oz (90.9 kg)  03/07/16 201 lb 9.6 oz (91.4 kg)    Physical Exam  Constitutional: He is oriented to person, place, and time. He appears well-developed and well-nourished.  HENT:  Mouth/Throat: Oropharynx is clear and moist.  Eyes: No scleral icterus.  Cardiovascular: Normal rate, regular rhythm and intact distal pulses.  Exam reveals no gallop and no friction rub.   No murmur heard. Pulmonary/Chest: No respiratory distress. He has no wheezes. He has no rales.  Abdominal: Soft. Bowel sounds are normal. He exhibits no distension and no mass. There is tenderness (generalized throughout). There is no guarding.  Neurological: He is alert and oriented to person, place, and time.  Skin: Skin is warm and dry.    Assessment and Plan :   1. Atypical chest pain 2. Abdominal pain, epigastric - H. pylori breath test pending. Labs pending. Palpitations not associated with his atypical chest pain. See #6 below.  3. Constipation, unspecified constipation type 4. History of appendectomy - Adhesions may be source of constipation. 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 I discussed using Miralax instead of coffee and also stool softeners. Patient will try this.  5. Alcohol consumption binge drinking - Discussed risks of binge drinking, patient will hold off from alcohol use for now or at least limit his drinking.  6. Palpitations -  Monitor, consider referral to cardiology for Holter monitor placement.  7. Class 1 obesity without serious comorbidity in adult, unspecified BMI, unspecified obesity type - Hemoglobin A1c pending. May be source of pan-positive review of systems.  I discussed need for a separate office visit to address all other ROS complaints. Patient agreed. For now he will use Zyrtec to address  possible contact dermatitis given that this occurred after coming into contact with a dog.  Wallis BambergMario Sierria Bruney, PA-C [facility] [phone] 06/15/2016 10:11 AM

## 2016-06-16 LAB — CBC
HEMATOCRIT: 47 % (ref 37.5–51.0)
Hemoglobin: 16.3 g/dL (ref 13.0–17.7)
MCH: 29 pg (ref 26.6–33.0)
MCHC: 34.7 g/dL (ref 31.5–35.7)
MCV: 84 fL (ref 79–97)
Platelets: 245 10*3/uL (ref 150–379)
RBC: 5.62 x10E6/uL (ref 4.14–5.80)
RDW: 14 % (ref 12.3–15.4)
WBC: 6.9 10*3/uL (ref 3.4–10.8)

## 2016-06-16 LAB — COMPREHENSIVE METABOLIC PANEL
ALT: 63 IU/L — AB (ref 0–44)
AST: 35 IU/L (ref 0–40)
Albumin/Globulin Ratio: 1.4 (ref 1.2–2.2)
Albumin: 4.6 g/dL (ref 3.5–5.5)
Alkaline Phosphatase: 68 IU/L (ref 39–117)
BUN/Creatinine Ratio: 12 (ref 9–20)
BUN: 14 mg/dL (ref 6–20)
Bilirubin Total: 0.5 mg/dL (ref 0.0–1.2)
CALCIUM: 10.1 mg/dL (ref 8.7–10.2)
CO2: 25 mmol/L (ref 18–29)
Chloride: 98 mmol/L (ref 96–106)
Creatinine, Ser: 1.13 mg/dL (ref 0.76–1.27)
GFR calc Af Amer: 101 mL/min/{1.73_m2} (ref >59)
GFR calc non Af Amer: 87 mL/min/{1.73_m2} (ref >59)
GLUCOSE: 88 mg/dL (ref 65–99)
Globulin, Total: 3.3 g/dL (ref 1.5–4.5)
Potassium: 4.7 mmol/L (ref 3.5–5.2)
Sodium: 141 mmol/L (ref 134–144)
Total Protein: 7.9 g/dL (ref 6.0–8.5)

## 2016-06-16 LAB — HEMOGLOBIN A1C
ESTIMATED AVERAGE GLUCOSE: 108 mg/dL
Hgb A1c MFr Bld: 5.4 % (ref 4.8–5.6)

## 2016-06-22 LAB — H.PYLORI BREATH TEST (REFLEX): H. PYLORI BREATH TEST: POSITIVE — AB

## 2016-06-22 LAB — H. PYLORI BREATH TEST

## 2016-06-23 ENCOUNTER — Other Ambulatory Visit: Payer: Self-pay | Admitting: Urgent Care

## 2016-06-23 ENCOUNTER — Telehealth: Payer: Self-pay

## 2016-06-23 DIAGNOSIS — A048 Other specified bacterial intestinal infections: Secondary | ICD-10-CM

## 2016-06-23 MED ORDER — CLARITHROMYCIN 500 MG PO TABS: 500.0000 mg | ORAL_TABLET | Freq: Two times a day (BID) | ORAL | 0 refills | Status: AC

## 2016-06-23 MED ORDER — AMOXICILLIN 500 MG PO CAPS: 1000.0000 mg | ORAL_CAPSULE | Freq: Two times a day (BID) | ORAL | 0 refills | Status: AC

## 2016-06-23 MED ORDER — OMEPRAZOLE 20 MG PO CPDR: 20.0000 mg | DELAYED_RELEASE_CAPSULE | Freq: Two times a day (BID) | ORAL | 1 refills | Status: AC

## 2016-06-23 NOTE — Telephone Encounter (Signed)
Changed his mind about switching pharmacies.

## 2016-06-29 ENCOUNTER — Ambulatory Visit: Payer: BLUE CROSS/BLUE SHIELD | Admitting: Urgent Care

## 2016-07-04 ENCOUNTER — Ambulatory Visit (INDEPENDENT_AMBULATORY_CARE_PROVIDER_SITE_OTHER): Payer: BLUE CROSS/BLUE SHIELD

## 2016-07-04 ENCOUNTER — Ambulatory Visit (INDEPENDENT_AMBULATORY_CARE_PROVIDER_SITE_OTHER): Payer: BLUE CROSS/BLUE SHIELD | Admitting: Urgent Care

## 2016-07-04 VITALS — BP 122/72 | HR 71 | Temp 97.9°F | Resp 17 | Ht 65.5 in | Wt 189.0 lb

## 2016-07-04 DIAGNOSIS — Z8709 Personal history of other diseases of the respiratory system: Secondary | ICD-10-CM

## 2016-07-04 DIAGNOSIS — R0789 Other chest pain: Secondary | ICD-10-CM

## 2016-07-04 DIAGNOSIS — A048 Other specified bacterial intestinal infections: Secondary | ICD-10-CM | POA: Diagnosis not present

## 2016-07-04 DIAGNOSIS — R000 Tachycardia, unspecified: Secondary | ICD-10-CM

## 2016-07-04 DIAGNOSIS — R002 Palpitations: Secondary | ICD-10-CM | POA: Diagnosis not present

## 2016-07-04 MED ORDER — ALBUTEROL SULFATE HFA 108 (90 BASE) MCG/ACT IN AERS: 2.0000 | INHALATION_SPRAY | Freq: Four times a day (QID) | RESPIRATORY_TRACT | 11 refills | Status: DC | PRN

## 2016-07-04 MED ORDER — ALBUTEROL SULFATE HFA 108 (90 BASE) MCG/ACT IN AERS: 2.0000 | INHALATION_SPRAY | Freq: Four times a day (QID) | RESPIRATORY_TRACT | 11 refills | Status: AC | PRN

## 2016-07-04 NOTE — Progress Notes (Addendum)
MRN: [id number] DOB: 03-08-1986  Subjective:   Mark Kane is a 31 y.o. male presenting for chief complaint of Follow-up (stomach pain )  Today, reports intermittent mid-left lower chest pain. Chest pain feels like a pinch, has been constant at times. Reports having coughed phlegm mixed with blood once. Denies fever, shob, wheezing, n/v, rashes. Denies trauma, falls, excessive heavy lifting or exercise. Has a history of He also admits ongoing intermittent palpitations, heart racing which have bothered him previously. He thought this was due to caffeine so he has not been drinking any of this. Has not been seen by cardiologist. Denies smoking cigarettes.  Mark Kane has a current medication list which includes the following prescription(s): amoxicillin, cholecalciferol, clarithromycin, docusate sodium, multivitamin, omega-3 fatty acids, omeprazole, and polyethylene glycol powder. Also has No Known Allergies.  Mark Kane  has a past medical history of Asthma; GERD (gastroesophageal reflux disease); Heart murmur; and History of asthma. 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  Objective:   Vitals: BP 122/72 (BP Location: Right Arm, Patient Position: Sitting, Cuff Size: Normal)   Pulse 71   Temp 97.9 F (36.6 C) (Oral)   Resp 17   Ht 5' 5.5" (1.664 m)   Wt 189 lb (85.7 kg)   SpO2 99%   BMI 30.97 kg/m   Wt Readings from Last 3 Encounters:  07/04/16 189 lb (85.7 kg)  06/15/16 190 lb 6.4 oz (86.4 kg)  03/14/16 200 lb 8 oz (90.9 kg)    Physical Exam  Constitutional: He is oriented to person, place, and time. He appears well-developed and well-nourished.  HENT:  Mouth/Throat: Oropharynx is clear and moist.  Eyes: No scleral icterus.  Neck: Normal range of motion. Neck supple.  Cardiovascular: Normal rate, regular rhythm and intact distal pulses.  Exam reveals no gallop and no friction rub.   No murmur  heard. Pulmonary/Chest: No respiratory distress. He has no wheezes. He has no rales. He exhibits no tenderness.  Abdominal: Soft. Bowel sounds are normal. He exhibits no distension and no mass. There is tenderness (over epigastric region). There is no guarding.  Lymphadenopathy:    He has no cervical adenopathy.  Neurological: He is alert and oriented to person, place, and time.  Skin: Skin is warm and dry.   Dg Chest 2 View  Result Date: 07/04/2016 CLINICAL DATA:  Atypical chest pain. History of asthma, cardiac murmur, former smoker. EXAM: CHEST  2 VIEW COMPARISON:  PA and lateral chest x-ray of November 19, 2014 FINDINGS: The lungs are adequately inflated and clear. There is no pneumothorax, pneumomediastinum, or pleural effusion. The heart and pulmonary vascularity are normal. The mediastinum is normal in width. The bony thorax exhibits no acute abnormality. IMPRESSION: Mild hyperinflation consistent with known reactive airway disease. No pneumonia nor other acute cardiopulmonary abnormality. Electronically Signed   By: David  SwazilandJordan M.D.   On: 07/04/2016 12:44   EKG interpretation by Dr. Neva SeatGreene - non-specific ST-depression in inferior lateral leads.  Assessment and Plan :   This case was precepted with Dr. Neva SeatGreene.   1. Atypical chest pain 2. Palpitations 3. Racing heart beat 4. History of asthma - I will have patient start albuterol inhaler given his symptoms and chest x-ray reading. Patient has a history of asthma and I believe he may be experiencing chest tightness as opposed to chest pain. [place] past [place]/fall and he may be having reactive  airway to changes in weather, different environment. Patient agreed to try albuterol, will f/u in 1 week. Consider further cardiac work up at follow up.  5. H. pylori infection - I will have patient maintain h. Pylori regimen.  Wallis Bamberg, PA-C [facility] at [facility] [phone] 07/04/2016  12:19 PM

## 2016-07-04 NOTE — Patient Instructions (Addendum)
Schedule your albuterol inhaler 6 hours apart or 3 times daily. If you experience jitteriness and have a difficult time sleeping then don't use the night time dose.     IF you received an x-ray today, you will receive an invoice from [facility]. Please contact [facility] at [phone] with questions or concerns regarding your invoice.   IF you received labwork today, you will receive an invoice from [facility]. Please contact [facility] at [phone] with questions or concerns regarding your invoice.   Our billing staff will not be able to assist you with questions regarding bills from these companies.  You will be contacted with the lab results as soon as they are available. The fastest way to get your results is to activate your My Chart account. Instructions are located on the last page of this paperwork. If you have not heard from us regarding the results in 2 weeks, please contact this office.

## 2016-07-05 LAB — SEDIMENTATION RATE: Sed Rate: 3 mm/hr (ref 0–15)

## 2016-07-05 LAB — CBC
HEMATOCRIT: 46.4 % (ref 37.5–51.0)
Hemoglobin: 15.5 g/dL (ref 13.0–17.7)
MCH: 27.9 pg (ref 26.6–33.0)
MCHC: 33.4 g/dL (ref 31.5–35.7)
MCV: 84 fL (ref 79–97)
Platelets: 244 10*3/uL (ref 150–379)
RBC: 5.55 x10E6/uL (ref 4.14–5.80)
RDW: 13.6 % (ref 12.3–15.4)
WBC: 6.8 10*3/uL (ref 3.4–10.8)

## 2016-07-13 ENCOUNTER — Encounter: Payer: Self-pay | Admitting: Urgent Care

## 2016-07-13 ENCOUNTER — Ambulatory Visit (INDEPENDENT_AMBULATORY_CARE_PROVIDER_SITE_OTHER): Payer: BLUE CROSS/BLUE SHIELD | Admitting: Urgent Care

## 2016-07-13 VITALS — BP 133/85 | HR 73 | Temp 98.4°F | Resp 18 | Ht 65.5 in | Wt 189.0 lb

## 2016-07-13 DIAGNOSIS — R000 Tachycardia, unspecified: Secondary | ICD-10-CM

## 2016-07-13 DIAGNOSIS — R002 Palpitations: Secondary | ICD-10-CM

## 2016-07-13 DIAGNOSIS — R0789 Other chest pain: Secondary | ICD-10-CM | POA: Diagnosis not present

## 2016-07-13 NOTE — Progress Notes (Signed)
    MRN: [id number] DOB: 04-26-1986  Subjective:   Mark Kane is a 31 y.o. male presenting for follow up on chest pain. Last office visit was 07/04/2016. At that time, x-ray was suggestive of reactive airway disease which is consistent with patient's history of childhood asthma. Patient was started on albuterol inhaler which he has used 2-3 times daily. Today, he reports dramatic improvement in his chest pain. He is now having it on occasion, not daily. However, he is still very concerned that he feels heart palpitations, heart pounding, intermittent racing heart beat. As noted before in his previous visits, patient denies drug use. Denies smoking cigarettes or drinking alcohol. He has stopped caffeine use, energy drinks for the past 1-2 months. Denies family history of arrhythmias.  Mark Kane has a current medication list which includes the following prescription(s): albuterol, amoxicillin, cholecalciferol, clarithromycin, docusate sodium, multivitamin, omega-3 fatty acids, omeprazole, and polyethylene glycol powder. Also has No Known Allergies.  Mark Kane  has a past medical history of Asthma; GERD (gastroesophageal reflux disease); Heart murmur; and History of asthma. 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  Objective:   Vitals: BP 133/85   Pulse 73   Temp 98.4 F (36.9 C) (Oral)   Resp 18   Ht 5' 5.5" (1.664 m)   Wt 189 lb (85.7 kg)   SpO2 100%   BMI 30.97 kg/m   Physical Exam  Constitutional: He is oriented to person, place, and time. He appears well-developed and well-nourished.  Cardiovascular: Normal rate, regular rhythm and intact distal pulses.  Exam reveals no gallop and no friction rub.   No murmur heard. Pulmonary/Chest: No respiratory distress. He has no wheezes. He has no rales.  Neurological: He is alert and oriented to person, place, and time.   Assessment and Plan :   1. Atypical  chest pain 2. Palpitations 3. Racing heart beat - Improved. Will pursue cardiology consult for non-specific abnormal ecg, palpitations, atypical chest pain. Patient will stop albuterol use to see if this affects his chest pain and will resume if it returns. Patient will notify me by mychart. Follow up s/p cardiology work up.   Wallis BambergMario Prather Failla, PA-C [facility] [phone] 07/13/2016 12:33 PM

## 2016-07-13 NOTE — Patient Instructions (Addendum)
Palpitations A palpitation is the feeling that your heartbeat is irregular or is faster than normal. It may feel like your heart is fluttering or skipping a beat. Palpitations are usually not a serious problem. They may be caused by many things, including smoking, caffeine, alcohol, stress, and certain medicines. Although most causes of palpitations are not serious, palpitations can be a sign of a serious medical problem. In some cases, you may need further medical evaluation. Follow these instructions at home: Pay attention to any changes in your symptoms. Take these actions to help with your condition:  Avoid the following:  Caffeinated coffee, tea, soft drinks, diet pills, and energy drinks.  Chocolate.  Alcohol.  Do not use any tobacco products, such as cigarettes, chewing tobacco, and e-cigarettes. If you need help quitting, ask your health care provider.  Try to reduce your stress and anxiety. Things that can help you relax include:  Yoga.  Meditation.  Physical activity, such as swimming, jogging, or walking.  Biofeedback. This is a method that helps you learn to use your mind to control things in your body, such as your heartbeats.  Get plenty of rest and sleep.  Take over-the-counter and prescription medicines only as told by your health care provider.  Keep all follow-up visits as told by your health care provider. This is important. Contact a health care provider if:  You continue to have a fast or irregular heartbeat after 24 hours.  Your palpitations occur more often. Get help right away if:  You have chest pain or shortness of breath.  You have a severe headache.  You feel dizzy or you faint. This information is not intended to replace advice given to you by your health care provider. Make sure you discuss any questions you have with your health care provider. Document Released: 05/26/2000 Document Revised: 11/01/2015 Document Reviewed: 02/11/2015 Elsevier  Interactive Patient Education  2017 [employer]     IF you received an x-ray today, you will receive an invoice from [facility]. Please contact [facility] at [phone] with questions or concerns regarding your invoice.   IF you received labwork today, you will receive an invoice from [facility]. Please contact [facility] at [phone] with questions or concerns regarding your invoice.   Our billing staff will not be able to assist you with questions regarding bills from these companies.  You will be contacted with the lab results as soon as they are available. The fastest way to get your results is to activate your My Chart account. Instructions are located on the last page of this paperwork. If you have not heard from us regarding the results in 2 weeks, please contact this office.

## 2017-03-12 ENCOUNTER — Ambulatory Visit: Payer: Self-pay | Admitting: Physician Assistant

## 2018-12-04 IMAGING — DX DG CHEST 2V
2 series · 2 of 2 positions shown · non-contrast
Comparison: PA and lateral chest x-ray November 19, 2014

CLINICAL DATA: Atypical chest pain. History of asthma, cardiac
murmur, former smoker.

EXAM:
CHEST  2 VIEW

[chest pa]
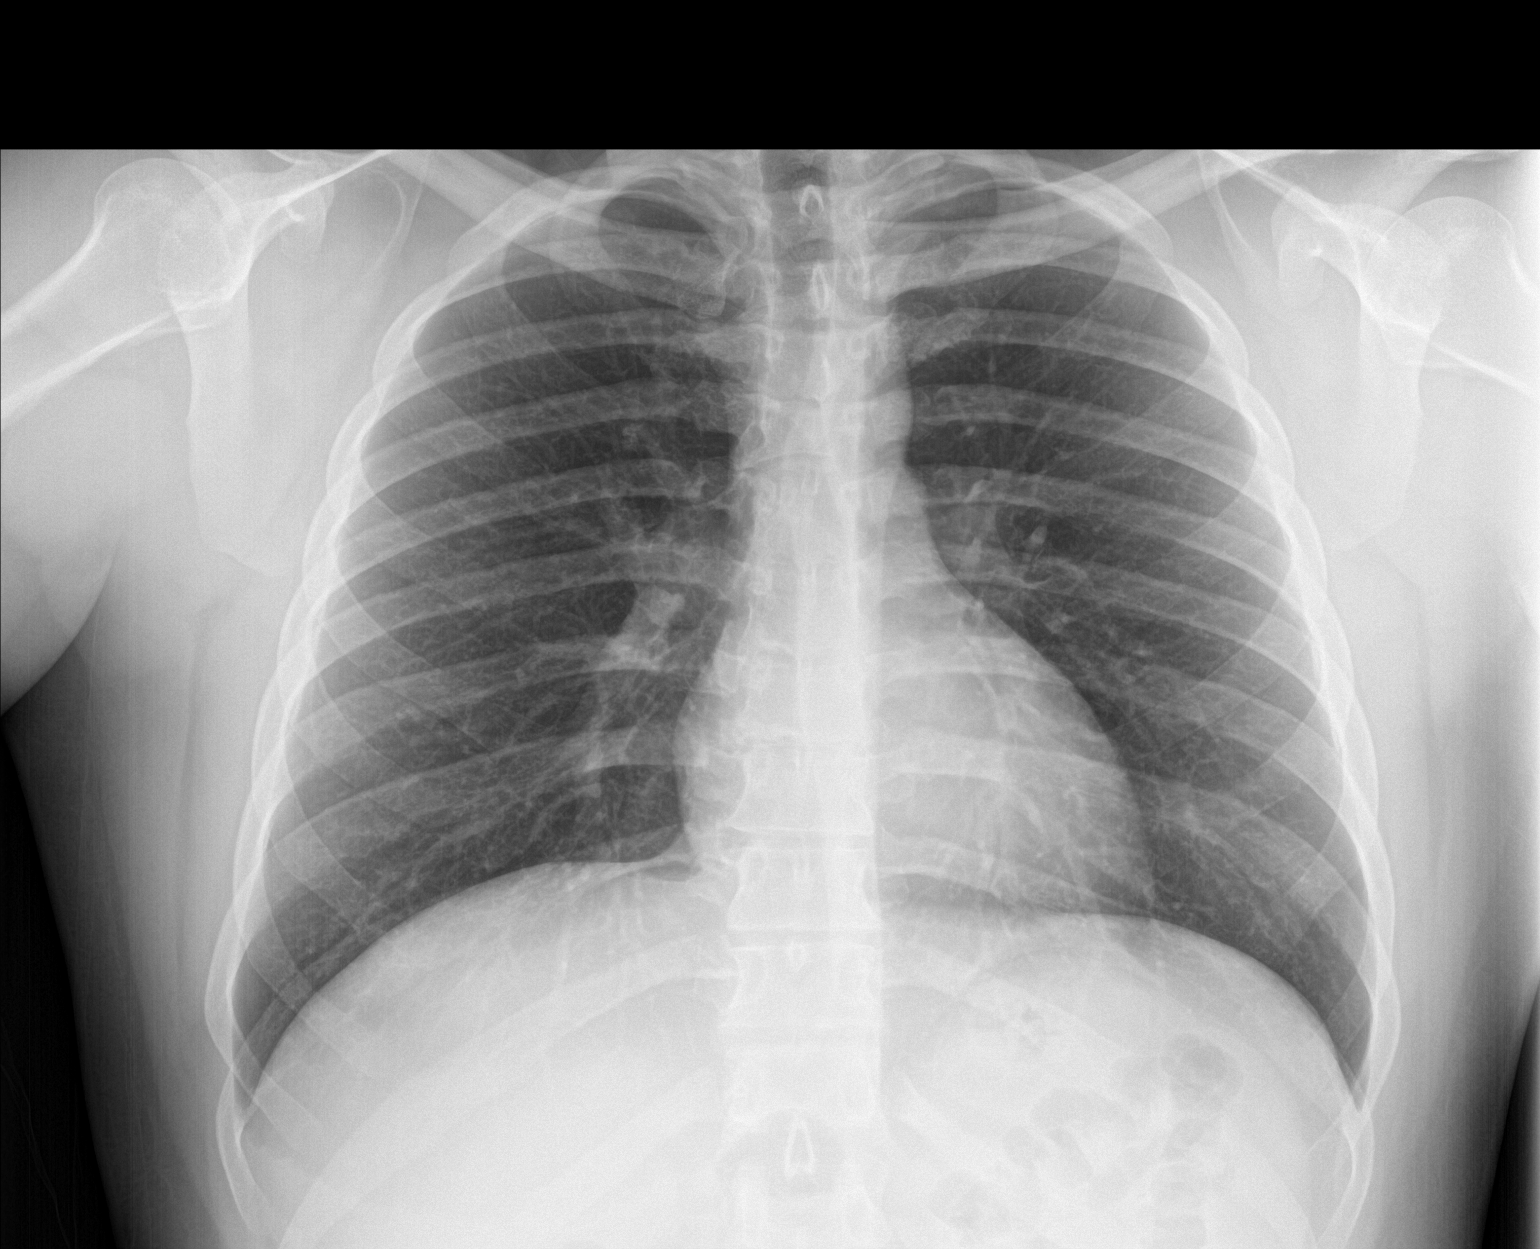

[chest lat]
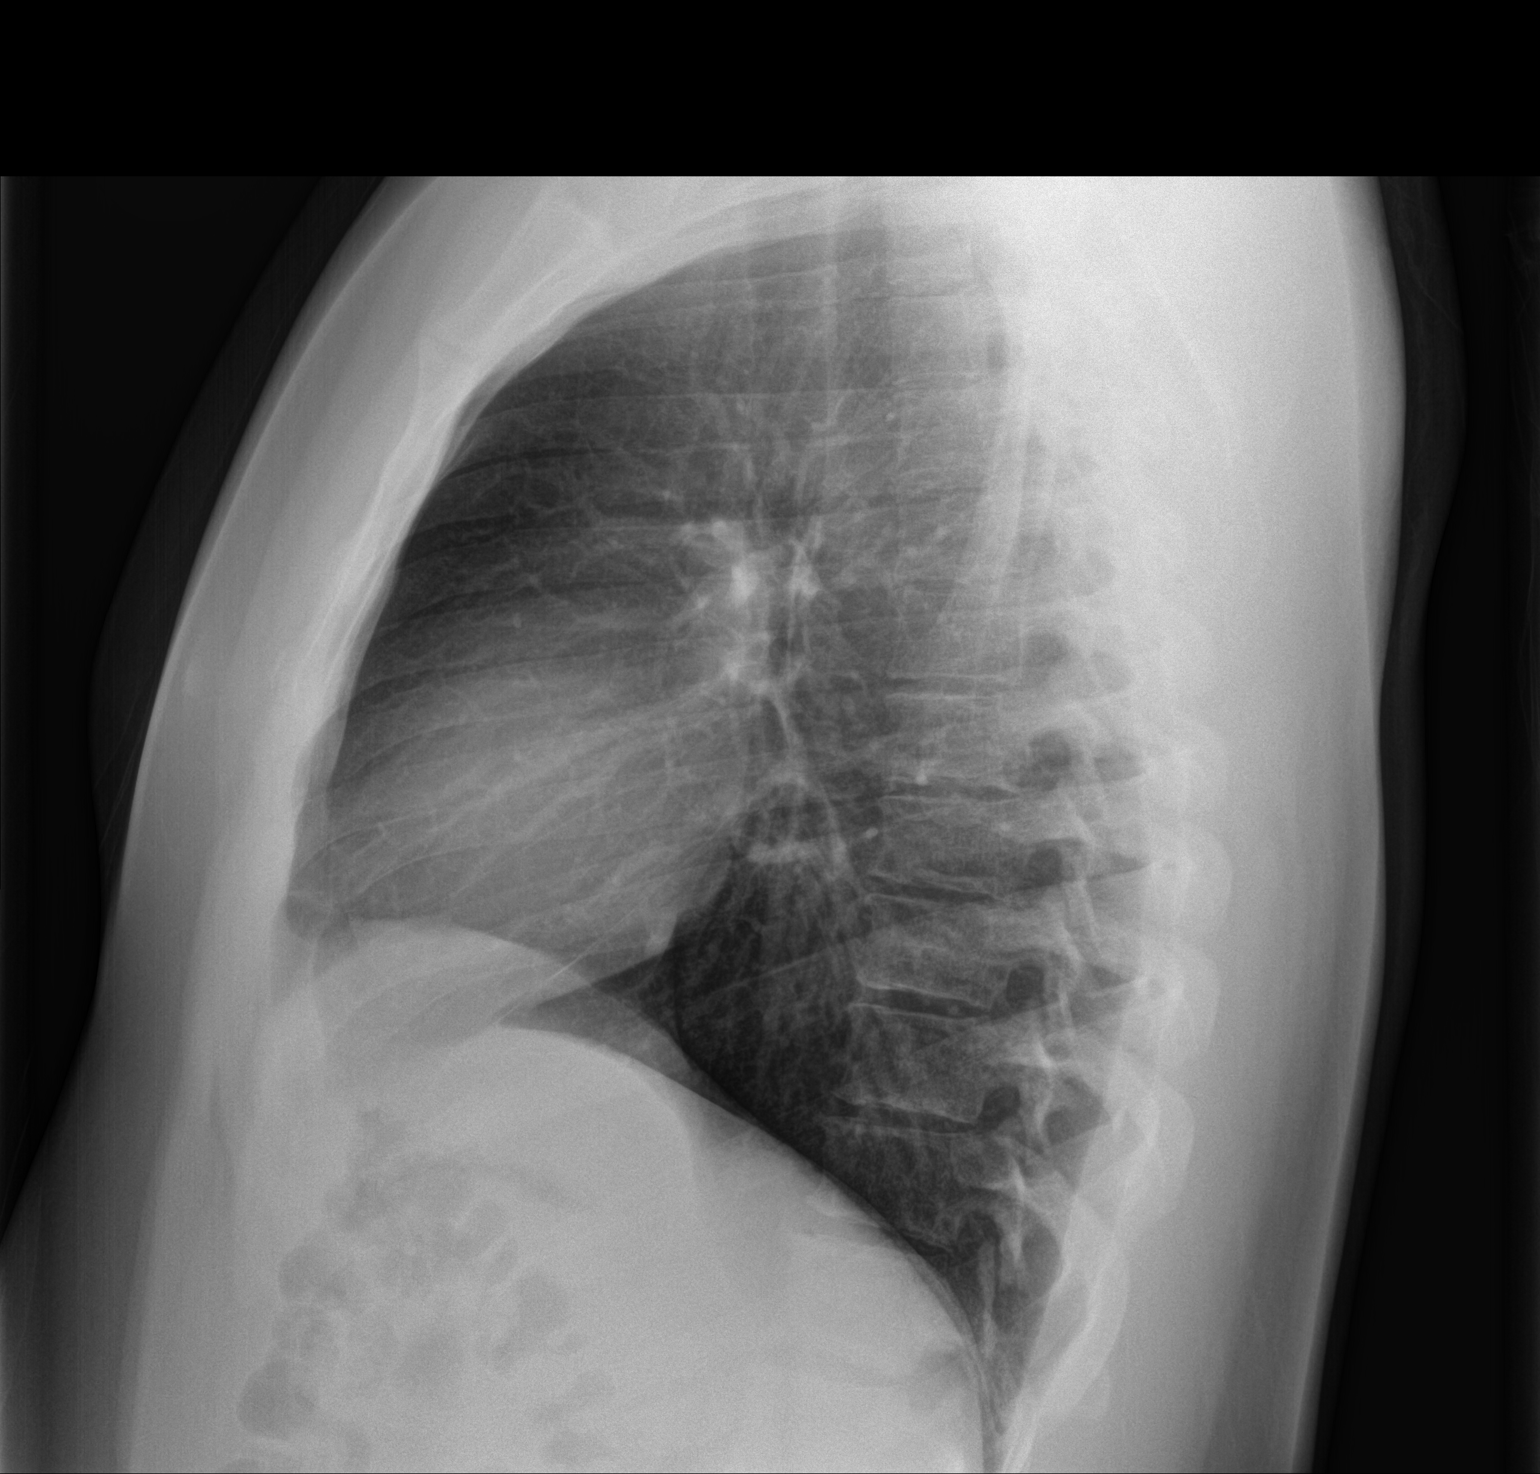

[2 of 2 positions shown; findings below may reference images not displayed]

FINDINGS: The lungs are adequately inflated and clear. There is no
pneumothorax, pneumomediastinum, or pleural effusion. The heart and
pulmonary vascularity are normal. The mediastinum is normal in
width. The bony thorax exhibits no acute abnormality.
IMPRESSION: Mild hyperinflation consistent with known reactive airway disease.
No pneumonia nor other acute cardiopulmonary abnormality.
